# Patient Record
Sex: Male | Born: 1985 | Hispanic: No | Marital: Married | Smoking: Current every day smoker
Health system: Southern US, Community
[De-identification: ages and names within clinical notes are randomized; demographics above are authoritative.]

## PROBLEM LIST (undated history)

## (undated) HISTORY — PX: FOOT SURGERY: SHX648

## (undated) HISTORY — PX: NOSE SURGERY: SHX723

---

## 2019-12-06 ENCOUNTER — Ambulatory Visit: Payer: Self-pay | Admitting: Nurse Practitioner

## 2020-10-11 ENCOUNTER — Emergency Department

## 2020-10-11 ENCOUNTER — Encounter: Payer: Self-pay | Admitting: Emergency Medicine

## 2020-10-11 ENCOUNTER — Other Ambulatory Visit: Payer: Self-pay

## 2020-10-11 ENCOUNTER — Emergency Department
Admission: EM | Admit: 2020-10-11 | Discharge: 2020-10-11 | Disposition: A | Discharge status: Home / Self Care | Attending: Emergency Medicine | Admitting: Emergency Medicine

## 2020-10-11 DIAGNOSIS — R1013 Epigastric pain: Secondary | ICD-10-CM | POA: Diagnosis not present

## 2020-10-11 DIAGNOSIS — R109 Unspecified abdominal pain: Secondary | ICD-10-CM | POA: Diagnosis present

## 2020-10-11 DIAGNOSIS — F17200 Nicotine dependence, unspecified, uncomplicated: Secondary | ICD-10-CM | POA: Diagnosis not present

## 2020-10-11 LAB — COMPREHENSIVE METABOLIC PANEL
ALT: 34 U/L (ref 0–44)
AST: 26 U/L (ref 15–41)
Albumin: 4.6 g/dL (ref 3.5–5.0)
Alkaline Phosphatase: 64 U/L (ref 38–126)
Anion gap: 11 (ref 5–15)
BUN: 14 mg/dL (ref 6–20)
CO2: 23 mmol/L (ref 22–32)
Calcium: 9.5 mg/dL (ref 8.9–10.3)
Chloride: 102 mmol/L (ref 98–111)
Creatinine, Ser: 0.82 mg/dL (ref 0.61–1.24)
GFR, Estimated: >60 mL/min (ref >60)
Glucose, Bld: 127 mg/dL — ABNORMAL HIGH (ref 70–99)
Potassium: 4 mmol/L (ref 3.5–5.1)
Sodium: 136 mmol/L (ref 135–145)
Total Bilirubin: 0.8 mg/dL (ref 0.3–1.2)
Total Protein: 7.5 g/dL (ref 6.5–8.1)

## 2020-10-11 LAB — URINALYSIS, COMPLETE (UACMP) WITH MICROSCOPIC
Bacteria, UA: NONE SEEN
Bilirubin Urine: NEGATIVE
Glucose, UA: NEGATIVE mg/dL
Ketones, ur: NEGATIVE mg/dL
Leukocytes,Ua: NEGATIVE
Nitrite: NEGATIVE
Protein, ur: NEGATIVE mg/dL
Specific Gravity, Urine: 1.01 (ref 1.005–1.030)
pH: 7 (ref 5.0–8.0)

## 2020-10-11 LAB — CBC
HCT: 46.6 % (ref 39.0–52.0)
Hemoglobin: 15.5 g/dL (ref 13.0–17.0)
MCH: 27.3 pg (ref 26.0–34.0)
MCHC: 33.3 g/dL (ref 30.0–36.0)
MCV: 82 fL (ref 80.0–100.0)
Platelets: 274 10*3/uL (ref 150–400)
RBC: 5.68 MIL/uL (ref 4.22–5.81)
RDW: 12.5 % (ref 11.5–15.5)
WBC: 9.1 10*3/uL (ref 4.0–10.5)
nRBC: 0 % (ref 0.0–0.2)

## 2020-10-11 LAB — LIPASE, BLOOD: Lipase: 26 U/L (ref 11–51)

## 2020-10-11 MED ORDER — ALUM & MAG HYDROXIDE-SIMETH 200-200-20 MG/5ML PO SUSP
30.0000 mL | Freq: Once | ORAL | Status: AC
  Administered 2020-10-11: 30 mL via ORAL
  Filled 2020-10-11: qty 30

## 2020-10-11 MED ORDER — LIDOCAINE VISCOUS HCL 2 % MT SOLN
15.0000 mL | Freq: Once | OROMUCOSAL | Status: AC
  Administered 2020-10-11: 15 mL via ORAL
  Filled 2020-10-11: qty 15

## 2020-10-11 MED ORDER — DICYCLOMINE HCL 10 MG/ML IM SOLN
20.0000 mg | Freq: Once | INTRAMUSCULAR | Status: AC
  Administered 2020-10-11: 20 mg via INTRAMUSCULAR
  Filled 2020-10-11 (×2): qty 2

## 2020-10-11 NOTE — Discharge Instructions (Addendum)
For your abdominal pain:  - I'd recommend taking an over-the-counter antacid daily - I'd recommend OMEPRAZOLE daily - this can be purchased OTC and taken once daily in the morning - An alternative is FAMOTIDINE/PEPCID one tablet daily - Try Tums as needed - Avoid spicy foods or foods high in acid

## 2020-10-11 NOTE — ED Notes (Signed)
Patient given water and crackers for PO challenge.

## 2020-10-11 NOTE — ED Provider Notes (Signed)
[facility] Emergency Department Provider Note  ____________________________________________   Event Date/Time   First MD Initiated Contact with Patient 10/11/20 1216     (approximate)  I have reviewed the triage vital signs and the nursing notes.   HISTORY  Chief Complaint Abdominal Pain    HPI Lance Hall is a 35 y.o. male  Here with abd pain, n/v. Pt reports that his sx began last night as initially mild aching, gnawing, epigastric discomfort and diffuse abd cramping. He felt like he had to have a BM so he went to try, but was not able to go to the restroom. Since then, he had intermittent sharp, stabbing, cramp like abd pain that at one point, was 10/10 in severity. He had associated nausea and did have one episode of vomiting, which markedly improved his pain and nausea. No blood in emesis. No h/o ulcers, no regular NSAID use. No other complaints. No fevers.        History reviewed. No pertinent past medical history.  There are no problems to display for this patient.   Past Surgical History:  Procedure Laterality Date  . FOOT SURGERY    . NOSE SURGERY      Prior to Admission medications   Not on File    Allergies Patient has no known allergies.  History reviewed. No pertinent family history.  Social History Social History   Tobacco Use  . Smoking status: Current Every Day Smoker  . Smokeless tobacco: Never Used  Substance Use Topics  . Alcohol use: Yes    Review of Systems  Review of Systems  Constitutional: Negative for chills, fatigue and fever.  HENT: Negative for sore throat.   Respiratory: Negative for shortness of breath.   Cardiovascular: Negative for chest pain.  Gastrointestinal: Positive for abdominal pain, nausea and vomiting.  Genitourinary: Negative for flank pain.  Musculoskeletal: Negative for neck pain.  Skin: Negative for rash and wound.  Allergic/Immunologic: Negative for immunocompromised state.   Neurological: Negative for weakness and numbness.  Hematological: Does not bruise/bleed easily.  All other systems reviewed and are negative.    ____________________________________________  PHYSICAL EXAM:      VITAL SIGNS: ED Triage Vitals  Enc Vitals Group     BP 10/11/20 1125 (!) 135/91     Pulse Rate 10/11/20 1125 87     Resp 10/11/20 1125 17     Temp 10/11/20 1125 98 F (36.7 C)     Temp Source 10/11/20 1125 Oral     SpO2 10/11/20 1125 99 %     Weight 10/11/20 1126 194 lb (88 kg)     Height 10/11/20 1126 5\' 7"  (1.702 m)     Head Circumference --      Peak Flow --      Pain Score 10/11/20 1125 2     Pain Loc --      Pain Edu? --      Excl. in GC? --      Physical Exam Vitals and nursing note reviewed.  Constitutional:      General: He is not in acute distress.    Appearance: He is well-developed.  HENT:     Head: Normocephalic and atraumatic.  Eyes:     Conjunctiva/sclera: Conjunctivae normal.  Cardiovascular:     Rate and Rhythm: Normal rate and regular rhythm.     Heart sounds: Normal heart sounds. No murmur heard. No friction rub.  Pulmonary:     Effort: Pulmonary effort  is normal. No respiratory distress.     Breath sounds: Normal breath sounds. No wheezing or rales.  Abdominal:     General: There is no distension.     Palpations: Abdomen is soft.     Tenderness: There is no abdominal tenderness. There is no right CVA tenderness, left CVA tenderness, guarding or rebound. Negative signs include Murphy's sign.  Musculoskeletal:     Cervical back: Neck supple.  Skin:    General: Skin is warm.     Capillary Refill: Capillary refill takes less than 2 seconds.  Neurological:     Mental Status: He is alert and oriented to person, place, and time.     Motor: No abnormal muscle tone.       ____________________________________________   LABS (all labs ordered are listed, but only abnormal results are displayed)  Labs Reviewed  COMPREHENSIVE  METABOLIC PANEL - Abnormal; Notable for the following components:      Result Value   Glucose, Bld 127 (*)    All other components within normal limits  URINALYSIS, COMPLETE (UACMP) WITH MICROSCOPIC - Abnormal; Notable for the following components:   Color, Urine YELLOW (*)    APPearance CLEAR (*)    Hgb urine dipstick SMALL (*)    All other components within normal limits  LIPASE, BLOOD  CBC    ____________________________________________  EKG:  ________________________________________  RADIOLOGY All imaging, including plain films, CT scans, and ultrasounds, independently reviewed by me, and interpretations confirmed via formal radiology reads.  ED MD interpretation:   AAS: Negative  Official radiology report(s): DG Abdomen Acute W/Chest  Result Date: 10/11/2020 CLINICAL DATA:  Acute generalized abdominal pain. EXAM: DG ABDOMEN ACUTE WITH 1 VIEW CHEST COMPARISON:  None. FINDINGS: There is no evidence of dilated bowel loops or free intraperitoneal air. No radiopaque calculi or other significant radiographic abnormality is seen. Heart size and mediastinal contours are within normal limits. Both lungs are clear. IMPRESSION: Negative abdominal radiographs.  No acute cardiopulmonary disease. Electronically Signed   By: Lupita Raider M.D.   On: 10/11/2020 13:54    ____________________________________________  PROCEDURES   Procedure(s) performed (including Critical Care):  Procedures  ____________________________________________  INITIAL IMPRESSION / MDM / ASSESSMENT AND PLAN / ED COURSE  As part of my medical decision making, I reviewed the following data within the electronic MEDICAL RECORD NUMBER Nursing notes reviewed and incorporated, Old chart reviewed, Notes from prior ED visits, and [place] Controlled Substance Database       *[name] was evaluated in Emergency Department on 10/11/2020 for the symptoms described in the history of present illness. He was evaluated in  the context of the global COVID-19 pandemic, which necessitated consideration that the patient might be at risk for infection with the SARS-CoV-2 virus that causes COVID-19. Institutional protocols and algorithms that pertain to the evaluation of patients at risk for COVID-19 are in a state of rapid change based on information released by regulatory bodies including the CDC and federal and state organizations. These policies and algorithms were followed during the patient's care in the ED.  Some ED evaluations and interventions may be delayed as a result of limited staffing during the pandemic.*     Medical Decision Making:  Very well appearing 35 yo M here with diffuse, intermittent abd cramping, improving in ED. Labs reviewed as above. CBC without leukocytosis. LFTs, lipase, renal function all wnl. On exam, he has no specific RUQ or RLQ TTP, no signs to suggest cholecystitis, appendicitis, obstruction,  or perforation. He is tolerating PO and sx improving with meds in ED. AAS unremarkable, no signs of obstruction or other abnormality. Suspect gastritis vs GERD vs food borne illness. Will treat symptomatically, refer for outpt follow-up as needed. Encouraged PPI/antacids.  ____________________________________________  FINAL CLINICAL IMPRESSION(S) / ED DIAGNOSES  Final diagnoses:  Epigastric pain     MEDICATIONS GIVEN DURING THIS VISIT:  Medications  alum & mag hydroxide-simeth (MAALOX/MYLANTA) 200-200-20 MG/5ML suspension 30 mL (30 mLs Oral Given 10/11/20 1254)    And  lidocaine (XYLOCAINE) 2 % viscous mouth solution 15 mL (15 mLs Oral Given 10/11/20 1253)  dicyclomine (BENTYL) injection 20 mg (20 mg Intramuscular Given 10/11/20 1255)     ED Discharge Orders    None       Note:  This document was prepared using Dragon voice recognition software and may include unintentional dictation errors.   Shaune Pollack, MD 10/11/20 1536

## 2020-10-11 NOTE — ED Triage Notes (Signed)
Pt comes into the ED via POV c/o abdominal pain that started yesterday after eating lunch.  Pt states the pain is intermittent but it will be a sharp pain and after emesis he feels better.  Pt denies any problems with GERD.  Pt currently is ambulatory to triage and has even and unlabored respirations.  Pt states he has some diarrhea as well.  Pt denies any abdominal surgeries.

## 2020-10-11 NOTE — ED Notes (Signed)
Patient at xray

## 2022-03-25 IMAGING — CR DG ABDOMEN ACUTE W/ 1V CHEST
1 series · 4 of 4 positions shown · non-contrast
Comparison: None.

CLINICAL DATA: Acute generalized abdominal pain.

EXAM:
DG ABDOMEN ACUTE WITH 1 VIEW CHEST

[Series 1: view not recorded · 0.14mm/px · 4 of 4 slices shown]
[im 1/4]
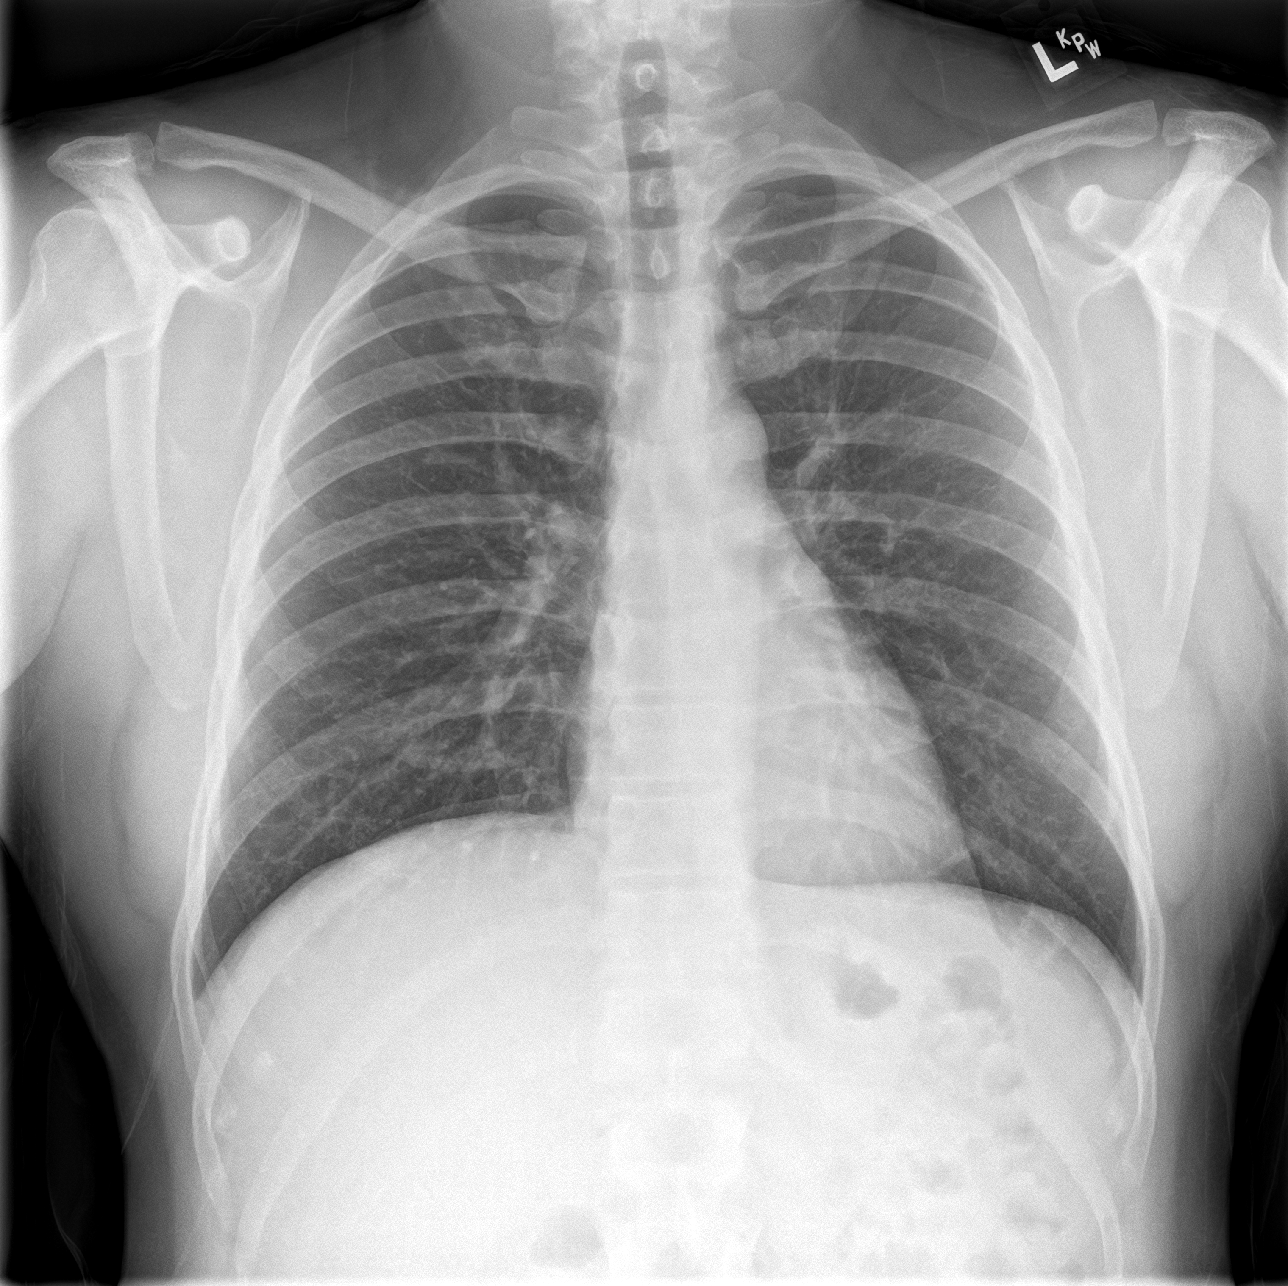
[im 2/4]
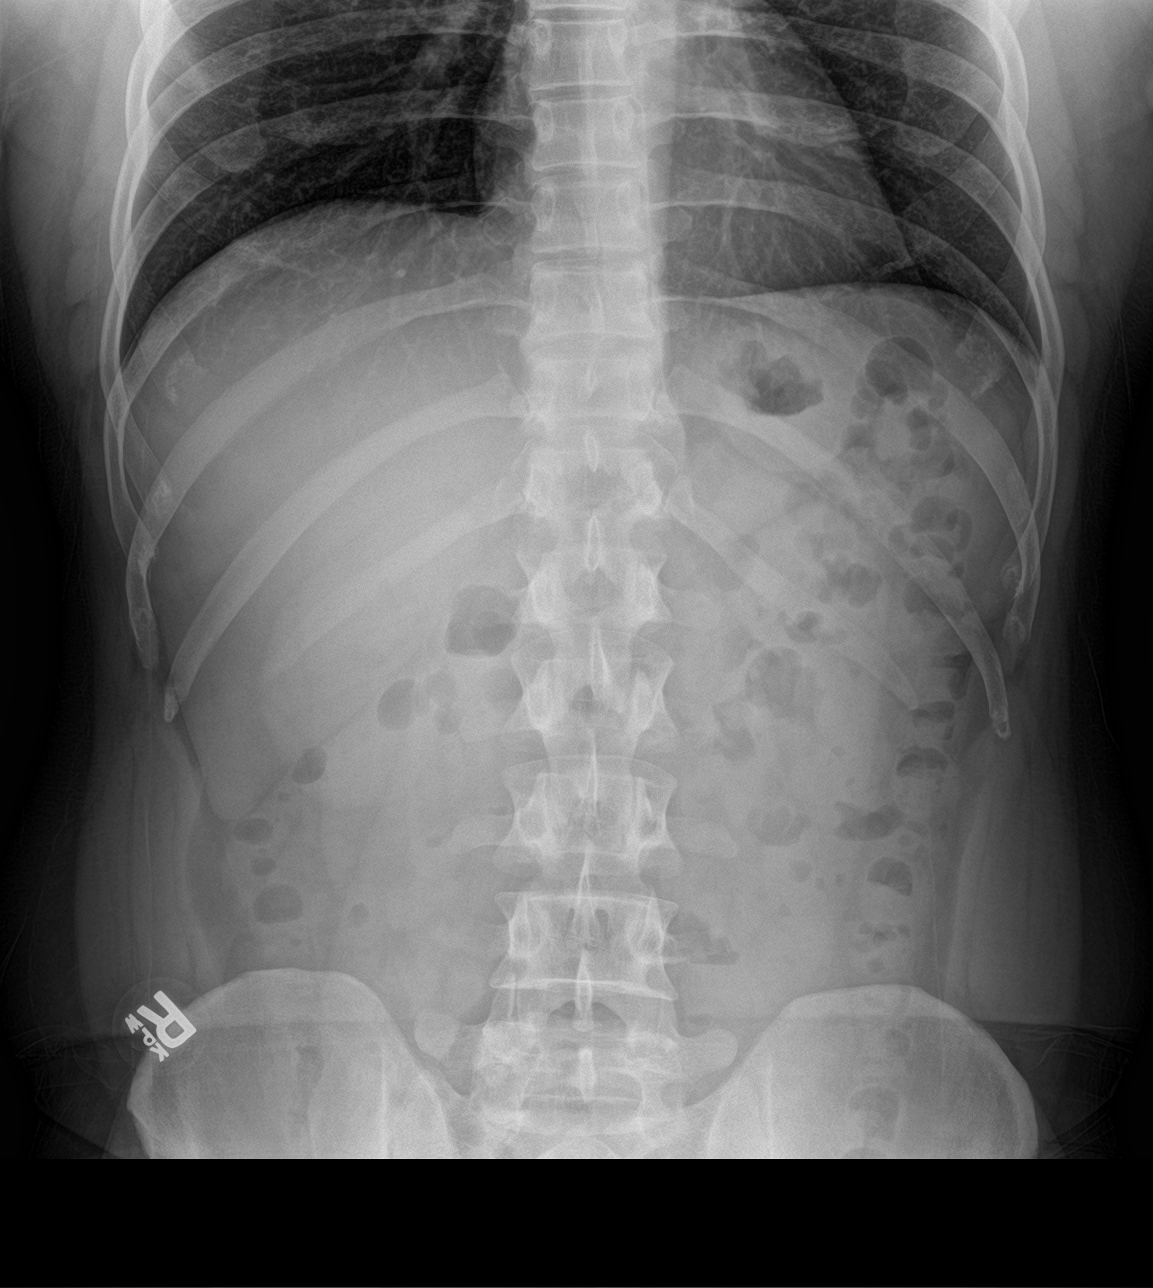
[im 3/4]
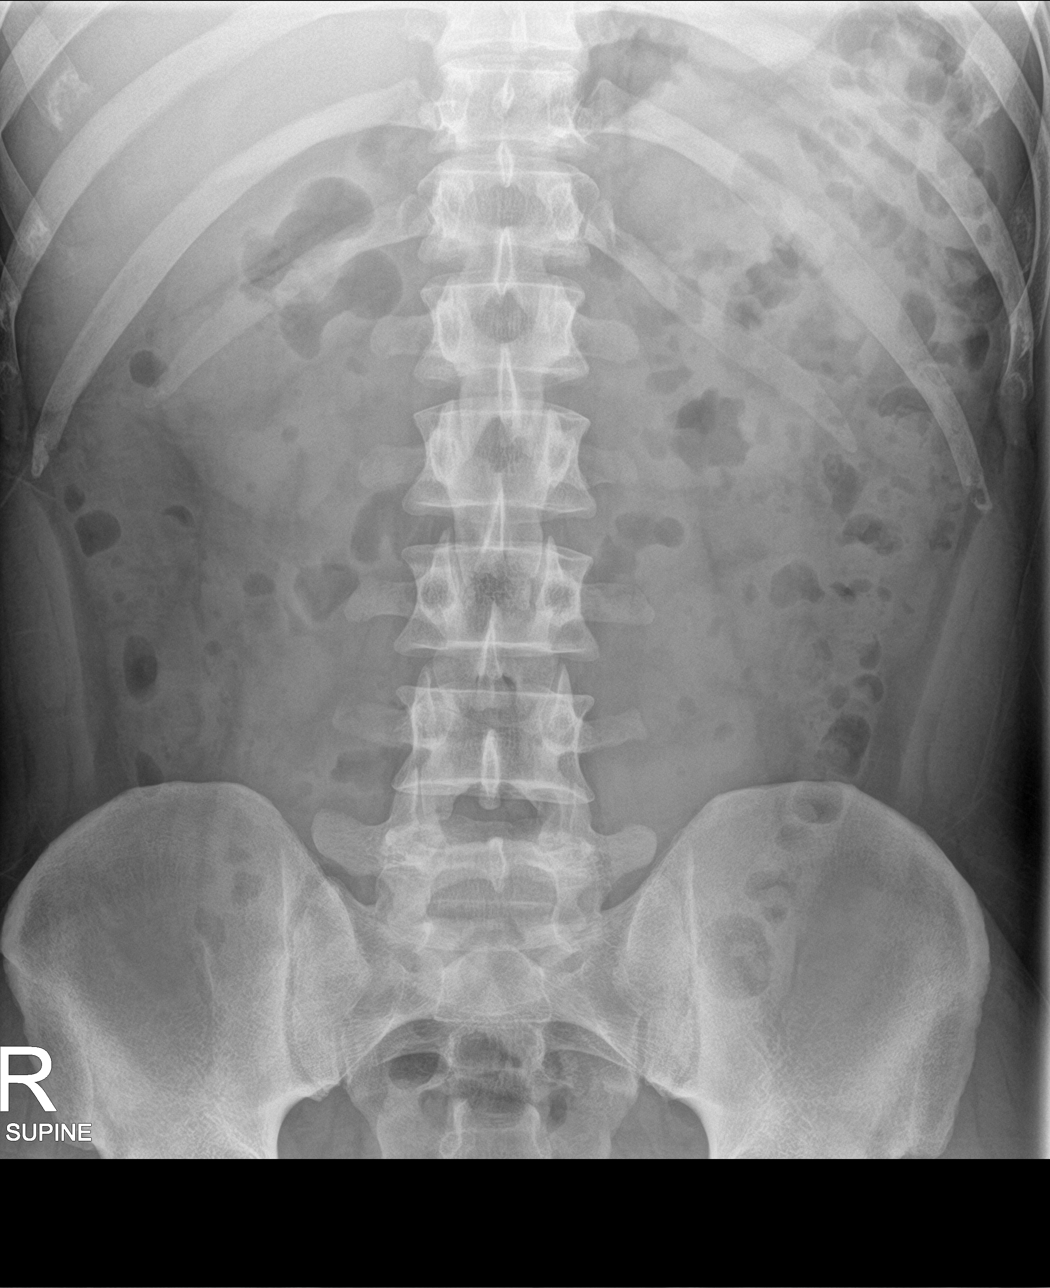
[im 4/4]
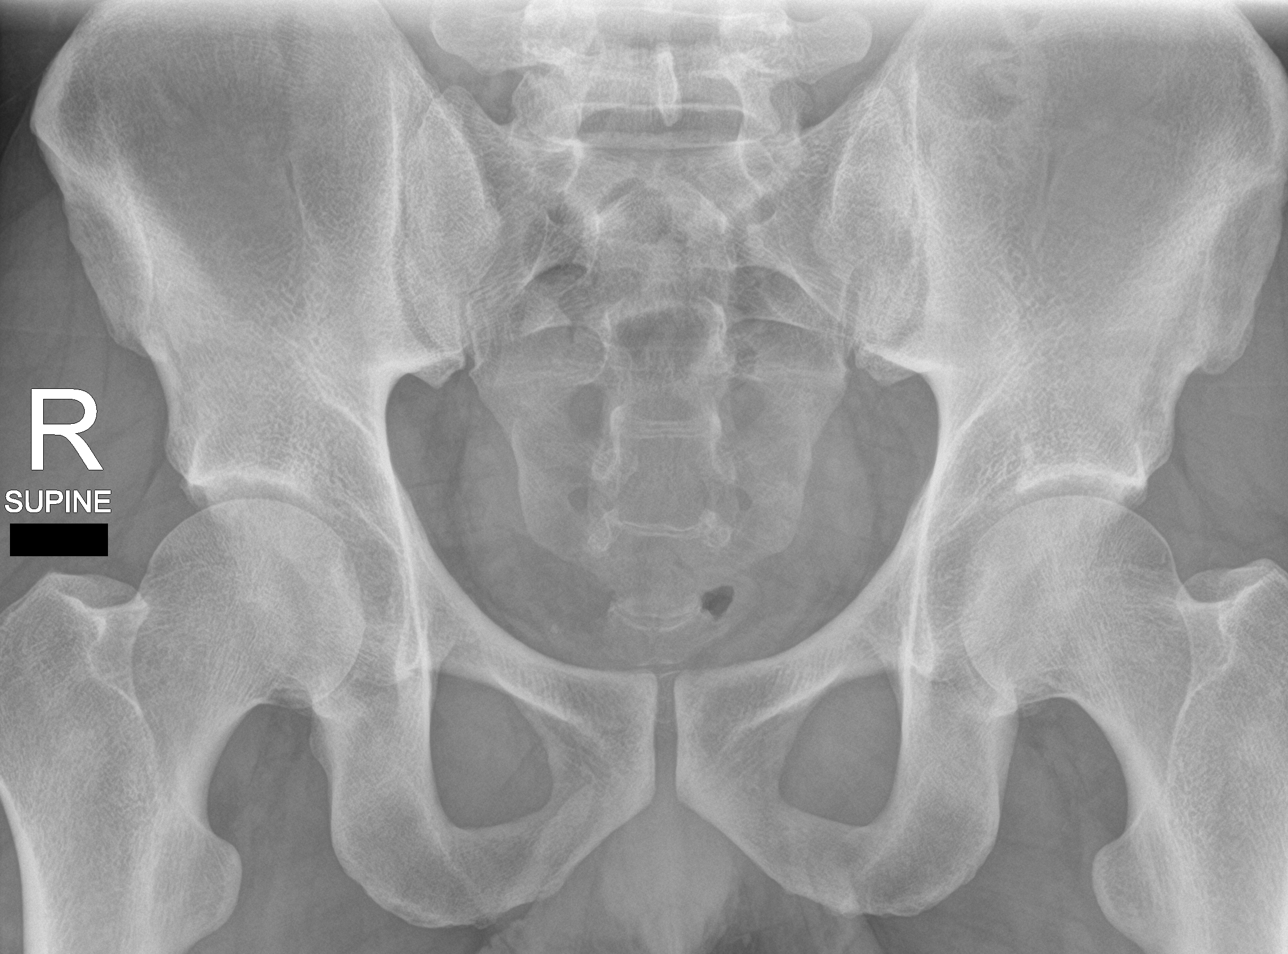

[4 of 4 positions shown; findings below may reference images not displayed]

FINDINGS: There is no evidence of dilated bowel loops or free intraperitoneal
air. No radiopaque calculi or other significant radiographic
abnormality is seen. Heart size and mediastinal contours are within
normal limits. Both lungs are clear.
IMPRESSION: Negative abdominal radiographs.  No acute cardiopulmonary disease.
